# Patient Record
Sex: Female | Born: 1960 | Race: Black or African American | Hispanic: No | Marital: Single
Health system: Southern US, Community
[De-identification: ages and names within clinical notes are randomized; demographics above are authoritative.]

---

## 1998-09-20 ENCOUNTER — Other Ambulatory Visit: Admission: RE | Admit: 1998-09-20 | Discharge: 1998-09-20 | Payer: Self-pay | Admitting: Obstetrics

## 1998-09-23 ENCOUNTER — Other Ambulatory Visit: Admission: RE | Admit: 1998-09-23 | Discharge: 1998-09-23 | Payer: Self-pay | Admitting: Obstetrics

## 2019-05-02 ENCOUNTER — Telehealth: Payer: Self-pay

## 2019-05-02 NOTE — Telephone Encounter (Signed)
PT CALLED TO [facility] NEW PT APPT W/[facility]ANDERS ADV HER AT THIS TIME SANDERS IS NOT ACCEPTING SHE CAN SCHEDULE W/MOORE DFNP, PHONE DISCONNECTED, TRIED CALLING BACK NO ANS

## 2019-06-26 ENCOUNTER — Ambulatory Visit: Payer: 59 | Attending: Internal Medicine

## 2019-06-26 DIAGNOSIS — Z20822 Contact with and (suspected) exposure to covid-19: Secondary | ICD-10-CM | POA: Insufficient documentation

## 2019-06-27 LAB — NOVEL CORONAVIRUS, NAA: SARS-CoV-2, NAA: NOT DETECTED

## 2019-07-14 ENCOUNTER — Other Ambulatory Visit: Payer: Self-pay | Admitting: Gastroenterology

## 2019-07-14 DIAGNOSIS — K769 Liver disease, unspecified: Secondary | ICD-10-CM

## 2019-07-20 ENCOUNTER — Other Ambulatory Visit: Payer: Self-pay | Admitting: Gastroenterology

## 2019-08-08 ENCOUNTER — Other Ambulatory Visit: Payer: Self-pay

## 2019-08-31 ENCOUNTER — Ambulatory Visit
Admission: RE | Admit: 2019-08-31 | Discharge: 2019-08-31 | Disposition: A | Discharge status: Home / Self Care | Payer: 59 | Source: Ambulatory Visit | Attending: Gastroenterology | Admitting: Gastroenterology

## 2019-08-31 ENCOUNTER — Other Ambulatory Visit: Payer: Self-pay

## 2019-08-31 DIAGNOSIS — K769 Liver disease, unspecified: Secondary | ICD-10-CM

## 2019-08-31 IMAGING — MR MR ABDOMEN WO/W CM
17 series · 48 of 48 positions shown · IV contrast (16ml Multihance)
Comparison: CT AP [DATE]

CLINICAL DATA: Evaluate liver lesion.

EXAM:
MRI ABDOMEN WITHOUT AND WITH CONTRAST
TECHNIQUE: Multiplanar multisequence MR imaging of the abdomen was performed
both before and after the administration of intravenous contrast.
CONTRAST:  16mL MULTIHANCE GADOBENATE DIMEGLUMINE 529 MG/ML IV SOLN

[Series 3: T2 · coronal · 5.0mm · 1.56mm/px · 1 of 36 slices shown (1 of 3)]
[im 1/36]
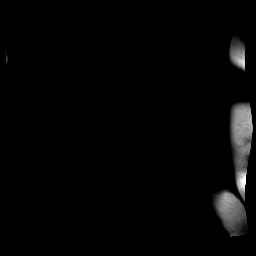

[Series 4: T1 · axial · 3.0mm · 1.19mm/px · z∈[-129,+108]mm · 6 of 160 slices shown]
[im 1/160]
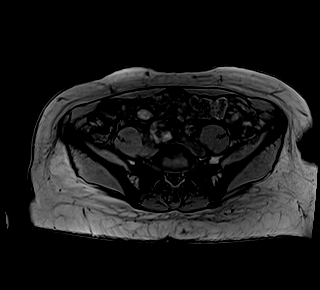
[im 32/160]
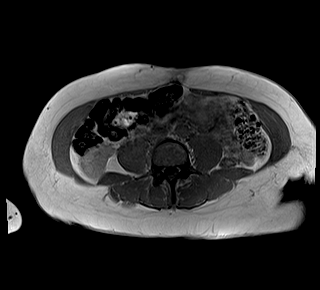
[im 64/160]
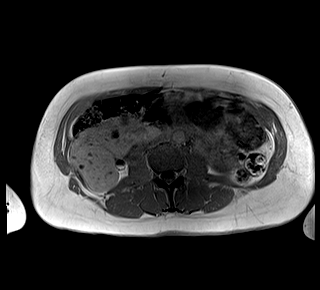
[im 96/160]
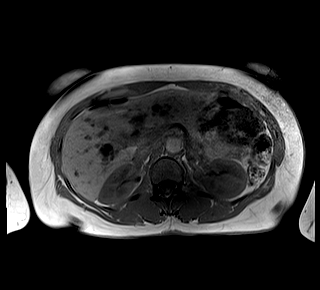
[im 128/160]
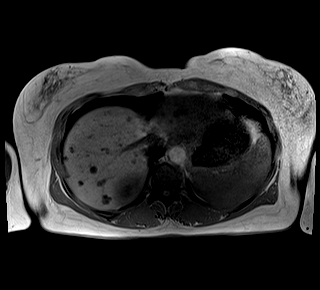
[im 160/160]
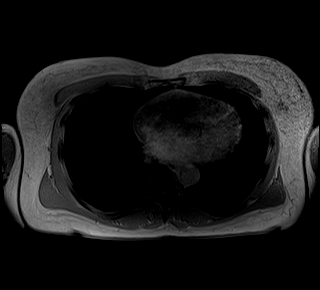

[Series 5: bSSFP · axial · 5.0mm · 1.25mm/px · 1 of 38 slices shown]
[im 1/38]
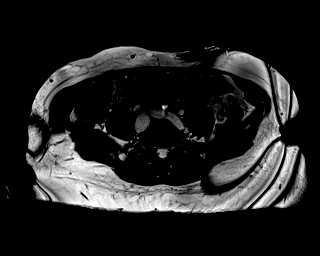

[Series 6: T2 · axial · 6.0mm · 1.19mm/px · 1 of 34 slices shown (2 of 3)]
[im 1/34]
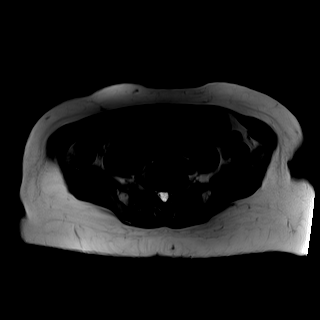

[Series 7: T2 · axial · 5.0mm · 1.48mm/px · z∈[-124,+128]mm · 2 of 43 slices shown (3 of 3)]
[im 1/43]
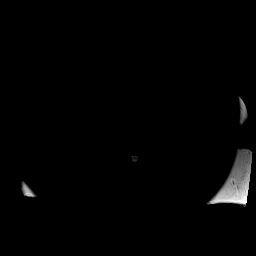
[im 43/43]
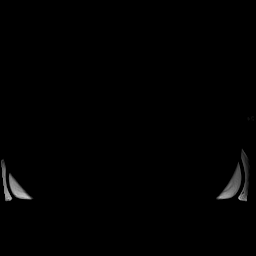

[Series 8: DWI · axial · 5.0mm · 1.42mm/px · z∈[-125,+115]mm · 5 of 123 slices shown (1 of 2)]
[im 1/123]
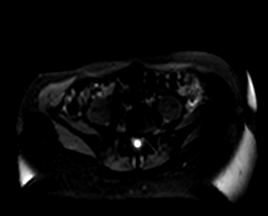
[im 31/123]
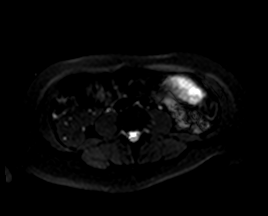
[im 62/123]
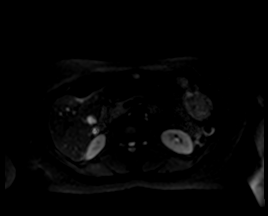
[im 92/123]
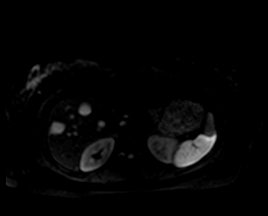
[im 123/123]
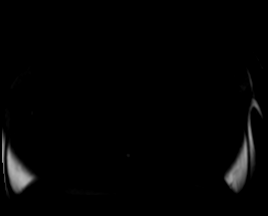

[Series 9: DWI · axial · 5.0mm · 1.42mm/px · z∈[-125,+115]mm · 2 of 41 slices shown (2 of 2)]
[im 1/41]
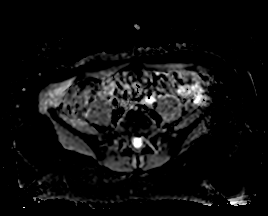
[im 41/41]
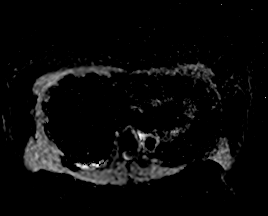

[Series 10: T1 dynamic · axial · non-contrast · 3.0mm · 1.25mm/px · z∈[-125,+112]mm · 3 of 80 slices shown]
[im 1/80]
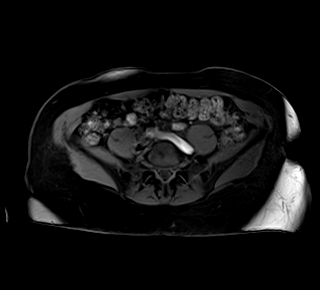
[im 40/80]
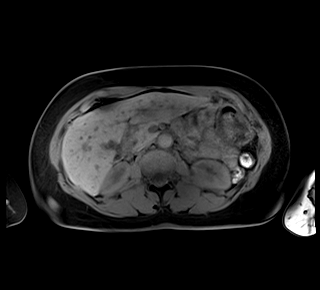
[im 80/80]
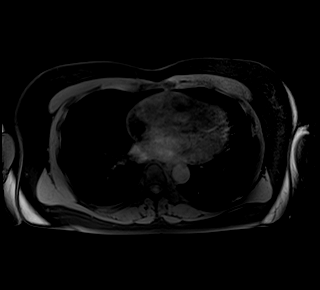

[Series 11: T1 dynamic post-contrast · axial · 3.0mm · 1.25mm/px · z∈[-125,+112]mm · 3 of 80 slices shown (1 of 9)]
[im 1/80]
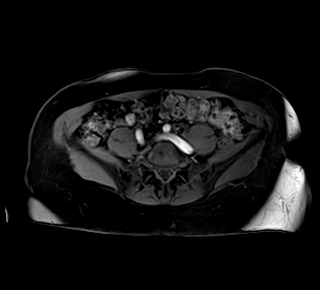
[im 40/80]
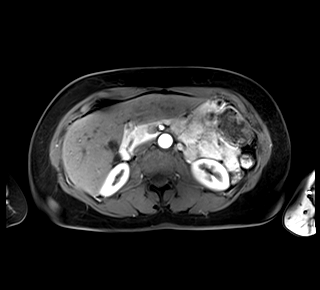
[im 80/80]
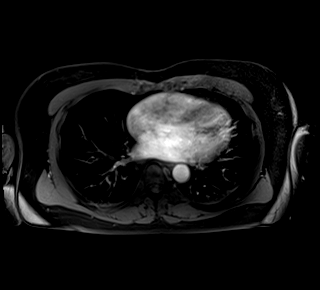

[Series 12: T1 dynamic post-contrast · axial · 3.0mm · 1.25mm/px · z∈[-125,+112]mm · 3 of 80 slices shown (2 of 9)]
[im 1/80]
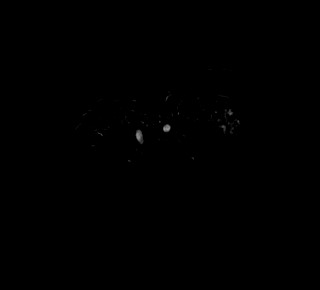
[im 40/80]
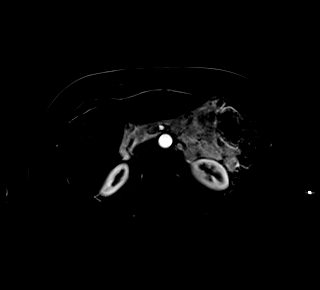
[im 80/80]
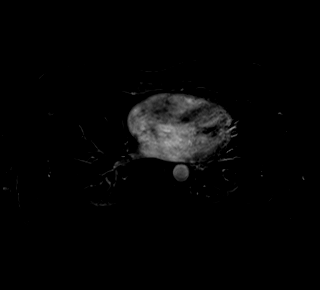

[Series 13: T1 dynamic post-contrast · axial · 3.0mm · 1.25mm/px · z∈[-125,+112]mm · 3 of 80 slices shown (3 of 9)]
[im 1/80]
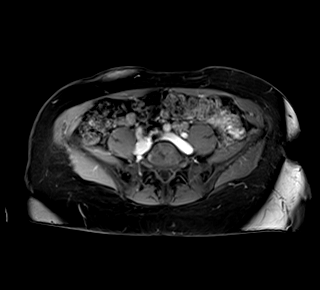
[im 40/80]
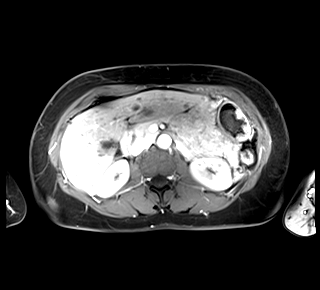
[im 80/80]
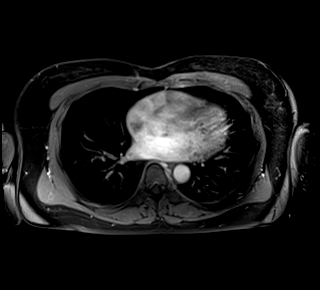

[Series 14: T1 dynamic post-contrast · axial · 3.0mm · 1.25mm/px · z∈[-125,+112]mm · 3 of 80 slices shown (4 of 9)]
[im 1/80]
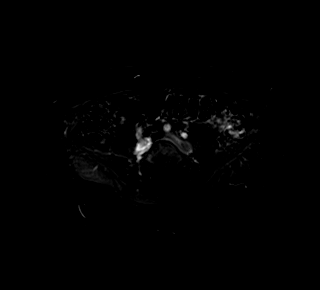
[im 40/80]
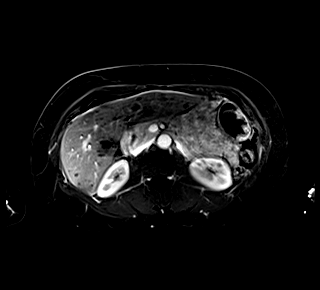
[im 80/80]
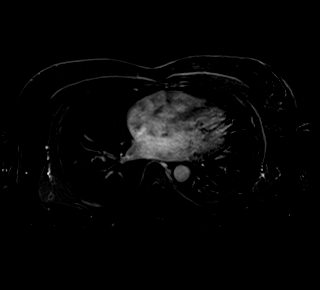

[Series 15: T1 dynamic post-contrast · axial · 3.0mm · 1.25mm/px · z∈[-125,+112]mm · 3 of 80 slices shown (5 of 9)]
[im 1/80]
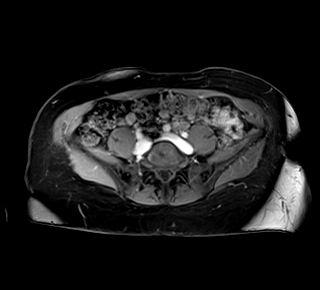
[im 40/80]
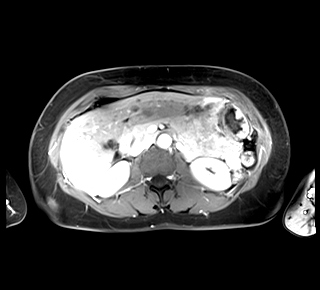
[im 80/80]
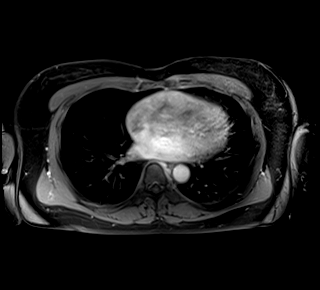

[Series 16: T1 dynamic post-contrast · axial · 3.0mm · 1.25mm/px · z∈[-125,+112]mm · 3 of 80 slices shown (6 of 9)]
[im 1/80]
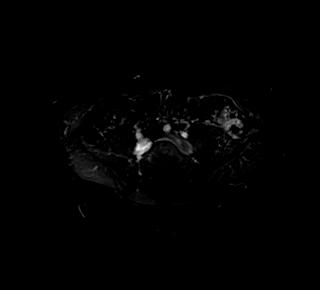
[im 40/80]
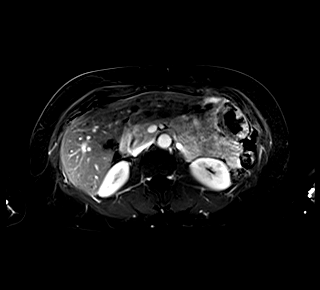
[im 80/80]
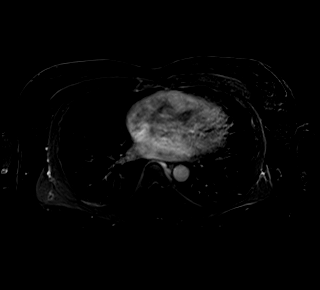

[Series 17: T1 dynamic post-contrast · coronal · 3.0mm · 1.25mm/px · 3 of 72 slices shown (7 of 9)]
[im 1/72]
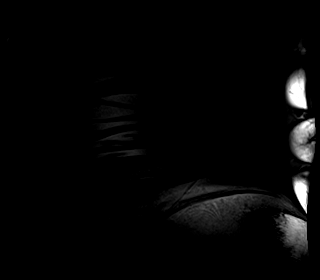
[im 36/72]
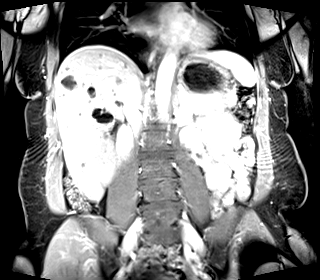
[im 72/72]
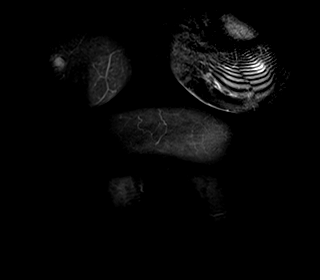

[Series 18: T1 dynamic post-contrast · axial · 3.0mm · 1.25mm/px · z∈[-125,+112]mm · 3 of 80 slices shown (8 of 9)]
[im 1/80]
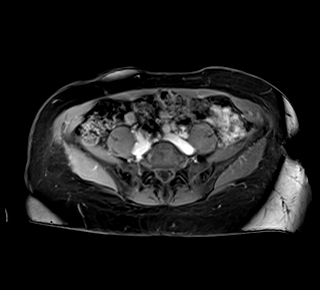
[im 40/80]
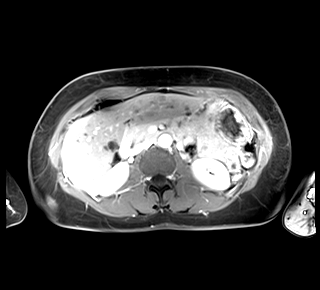
[im 80/80]
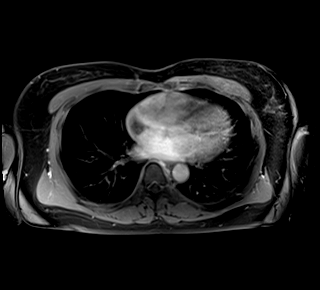

[Series 19: T1 dynamic post-contrast · axial · 3.0mm · 1.25mm/px · z∈[-125,+112]mm · 3 of 80 slices shown (9 of 9)]
[im 1/80]
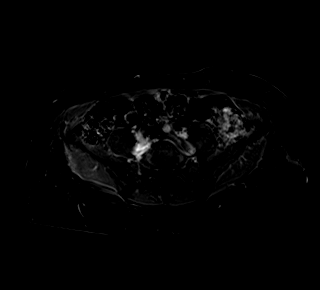
[im 40/80]
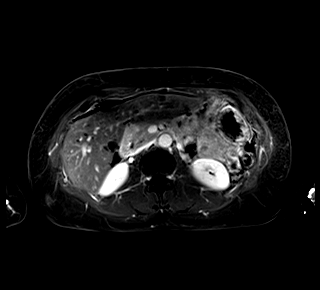
[im 80/80]
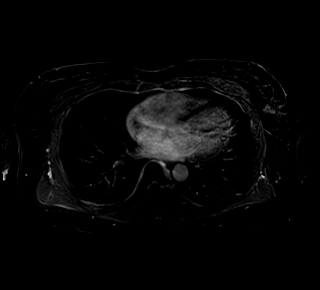

[48 of 48 positions shown; findings below may reference images not displayed]

FINDINGS: Lower chest: No acute findings.

Hepatobiliary: Normal appearance the gallbladder. No stones or
gallbladder wall thickening. The common bile duct is upper limits of
normal in caliber measuring 6 mm. Mild central biliary prominence.

Innumerable small liver cysts are identified throughout both lobes
of the liver as noted previously. Conglomeration of cysts with
central calcification is again noted within the left lobe of liver.
This measures 1.9 x 1.8 cm, image [DATE]. Unchanged from previous
exam. No convincing evidence for abnormal enhancement within this
area.

Pancreas: No mass, inflammatory changes, or other parenchymal
abnormality identified.

Spleen:  Within normal limits in size and appearance.

Adrenals/Urinary Tract: Normal appearance of the right kidney. Small
left kidney cyst is technically too small to reliably characterize
measuring 7 mm, image 38/15. No mass or hydronephrosis identified
bilaterally.

Stomach/Bowel: Visualized portions within the abdomen are
unremarkable.

Vascular/Lymphatic: No pathologically enlarged lymph nodes
identified. No abdominal aortic aneurysm demonstrated.

Other:  Fat containing umbilical hernia identified.

Musculoskeletal: No suspicious bone lesions identified.
IMPRESSION: 1. No acute findings within the upper abdomen.
2. Innumerable small liver cysts.
3. Conglomeration of cysts with central calcification is again noted
within the left lobe of liver. No abnormal enhancement identified.
This is unchanged in appearance from [DATE] and is favored to
represent a benign abnormality. Consider follow-up imaging in 12
months with liver protocol MRI or CT without and with contrast
material to ensure stability of this likely benign abnormality.
4. Fat containing umbilical hernia.

## 2019-08-31 MED ORDER — GADOBENATE DIMEGLUMINE 529 MG/ML IV SOLN
16.0000 mL | Freq: Once | INTRAVENOUS | Status: AC | PRN
  Administered 2019-08-31: 16 mL via INTRAVENOUS

## 2020-11-26 ENCOUNTER — Other Ambulatory Visit: Payer: Self-pay | Admitting: Gastroenterology

## 2020-11-26 DIAGNOSIS — K769 Liver disease, unspecified: Secondary | ICD-10-CM

## 2020-12-10 ENCOUNTER — Ambulatory Visit
Admission: RE | Admit: 2020-12-10 | Discharge: 2020-12-10 | Disposition: A | Discharge status: Home / Self Care | Payer: 59 | Source: Ambulatory Visit | Attending: Gastroenterology | Admitting: Gastroenterology

## 2020-12-10 ENCOUNTER — Other Ambulatory Visit: Payer: Self-pay

## 2020-12-10 DIAGNOSIS — K769 Liver disease, unspecified: Secondary | ICD-10-CM

## 2020-12-10 IMAGING — MR MR ABDOMEN WO/W CM
12 of 17 series · 26 of 48 positions shown · IV contrast (15 ml multihance)
Comparison: CT [DATE] and MRI [DATE]

CLINICAL DATA: Follow-up hepatic lesions.

EXAM:
MRI ABDOMEN WITHOUT AND WITH CONTRAST
TECHNIQUE: Multiplanar multisequence MR imaging of the abdomen was performed
both before and after the administration of intravenous contrast.
CONTRAST:  15mL MULTIHANCE GADOBENATE DIMEGLUMINE 529 MG/ML IV SOLN

[Series 3: cor haste · coronal · 5.0mm · 0.68mm/px · 1 of 28 slices shown]
[im 1/28]
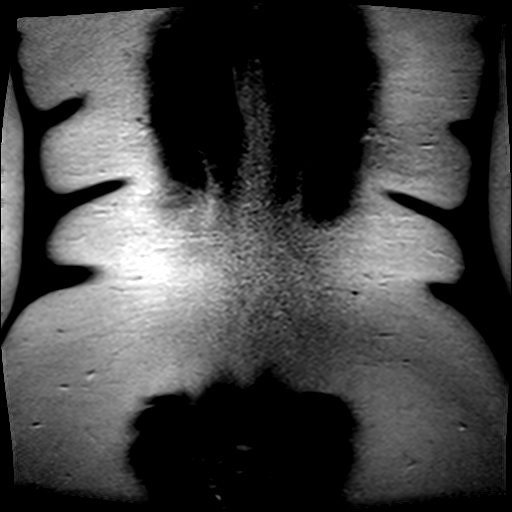

[Series 4: axial haste · axial · 6.0mm · 0.68mm/px · 1 of 37 slices shown]
[im 1/37]
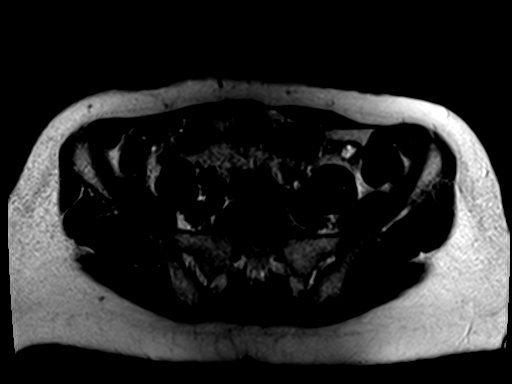

[Series 5: T1 · axial · 6.0mm · 0.68mm/px · z∈[-119,+118]mm · 2 of 74 slices shown]
[im 1/74]
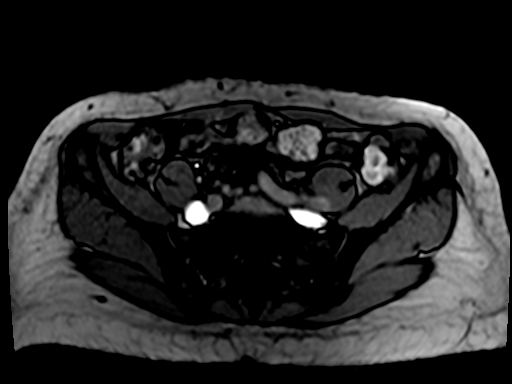
[im 74/74]
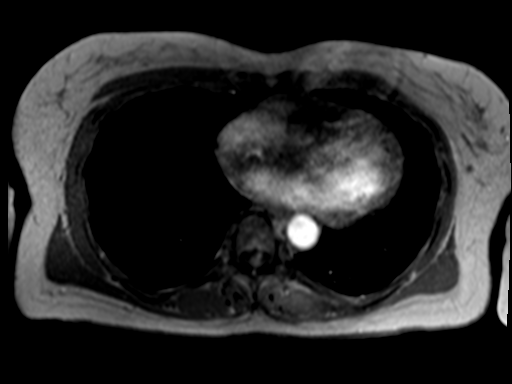

[Series 6: bSSFP · axial · 4.0mm · 0.68mm/px · 1 of 61 slices shown]
[im 1/61]
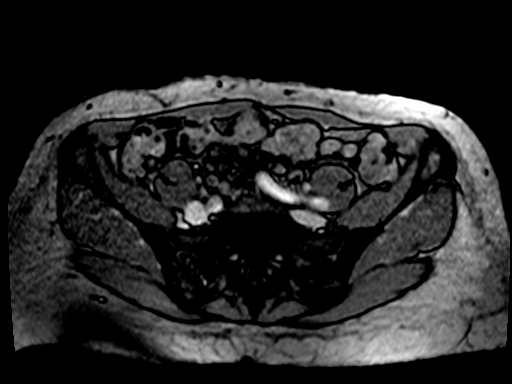

[Series 7: T2 fat-sat · axial · 6.0mm · 1.09mm/px · 1 of 34 slices shown]
[im 1/34]
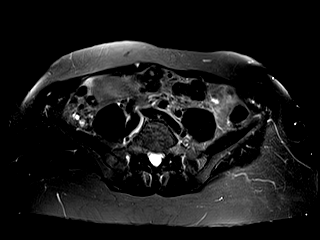

[Series 8: [id number]_trig · axial · 6.0mm · 1.82mm/px · z∈[-101,+143]mm · 3 of 105 slices shown]
[im 1/105]
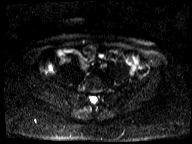
[im 53/105]
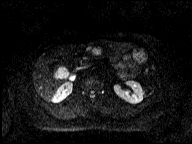
[im 105/105]
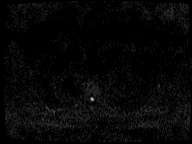

[Series 9: [id number]_trig_adc · axial · 6.0mm · 1.82mm/px · 1 of 35 slices shown]
[im 1/35]
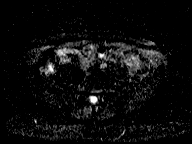

[Series 10: T1 dynamic · axial · non-contrast · 2.5mm · 0.74mm/px · z∈[-127,+130]mm · 3 of 104 slices shown]
[im 1/104]
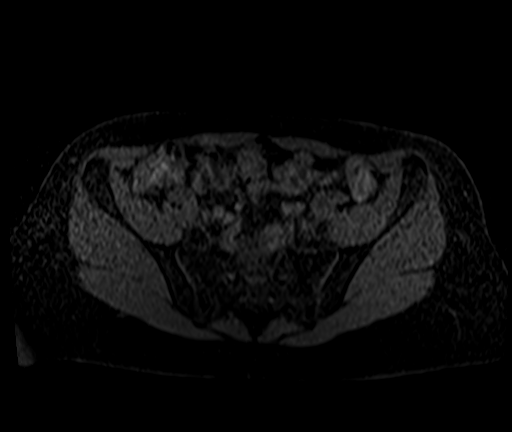
[im 52/104]
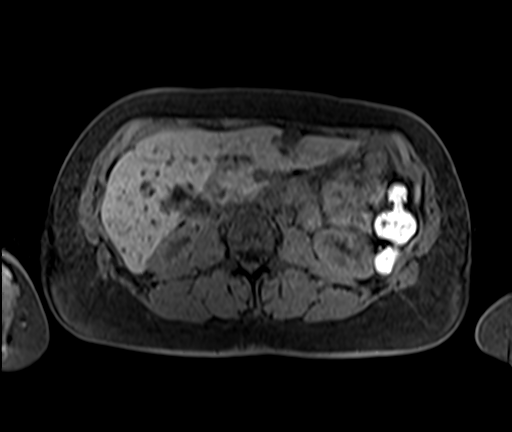
[im 104/104]
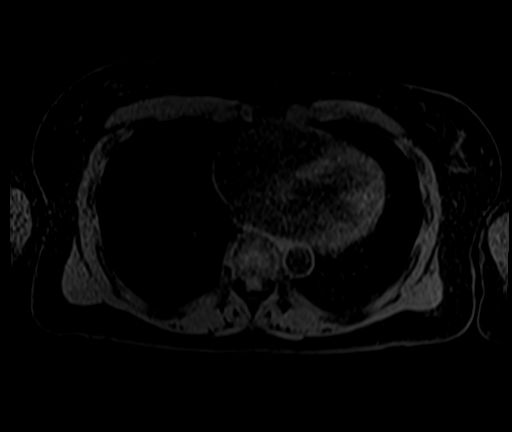

[Series 11: T1 dynamic post-contrast · axial · 2.5mm · 0.74mm/px · z∈[-127,+130]mm · 4 of 104 slices shown (1 of 4)]
[im 1/104]
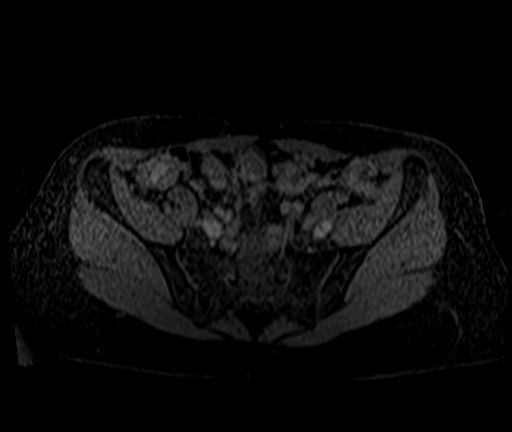
[im 35/104]
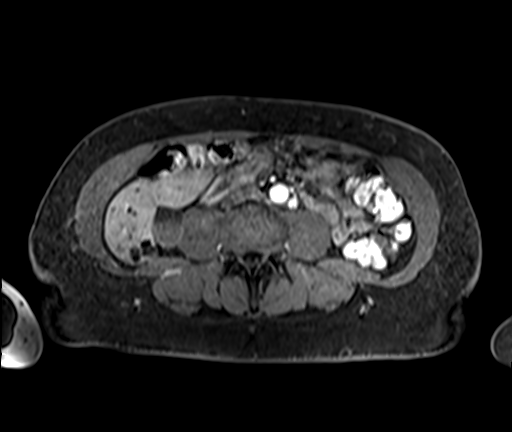
[im 69/104]
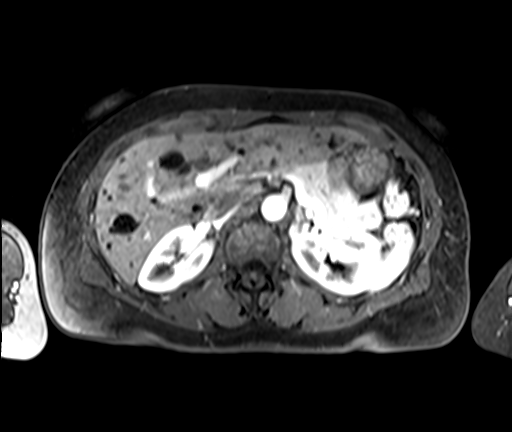
[im 104/104]
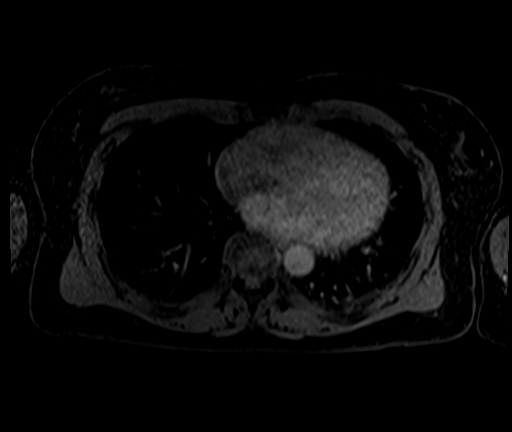

[Series 12: T1 dynamic post-contrast · axial · 2.5mm · 0.74mm/px · z∈[-127,+130]mm · 4 of 104 slices shown (2 of 4)]
[im 1/104]
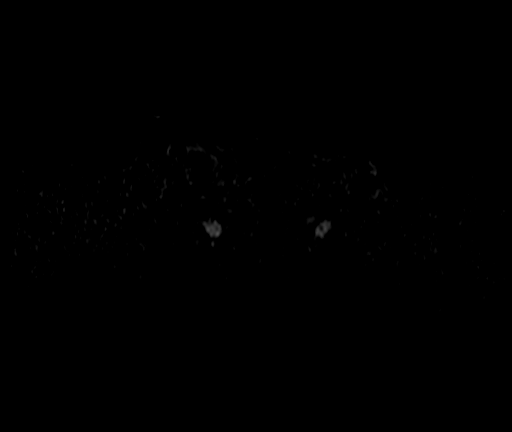
[im 35/104]
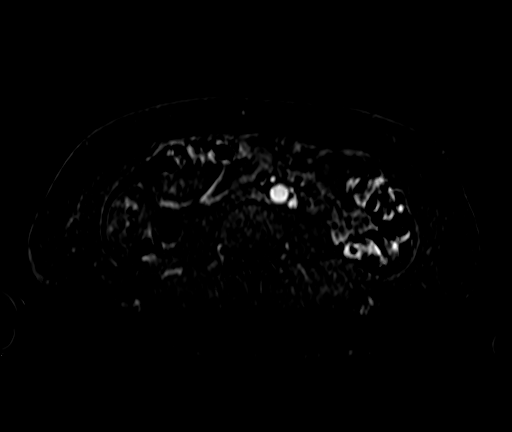
[im 69/104]
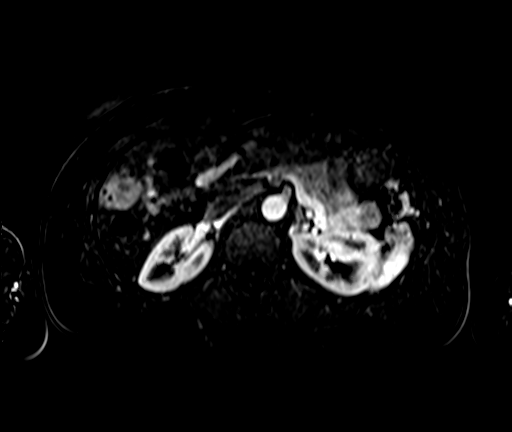
[im 104/104]
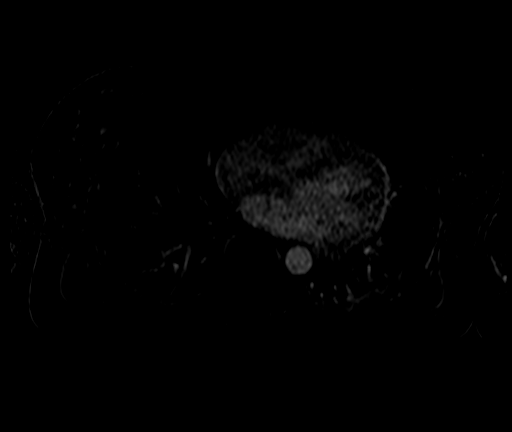

[Series 13: T1 dynamic post-contrast · axial · 2.5mm · 0.74mm/px · z∈[-127,+130]mm · 4 of 104 slices shown (3 of 4)]
[im 1/104]
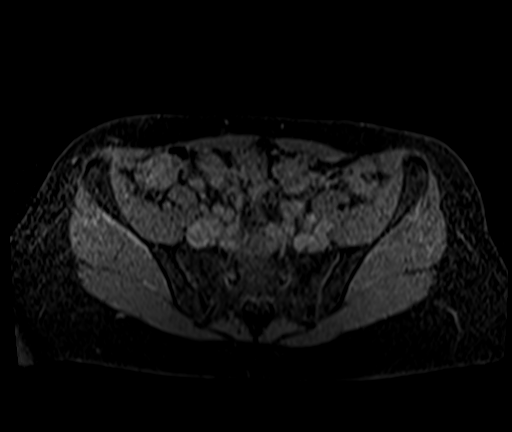
[im 35/104]
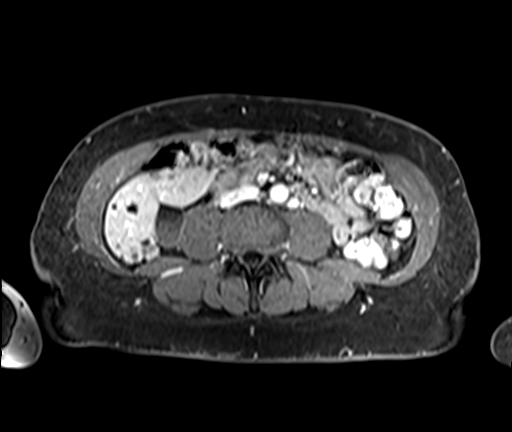
[im 69/104]
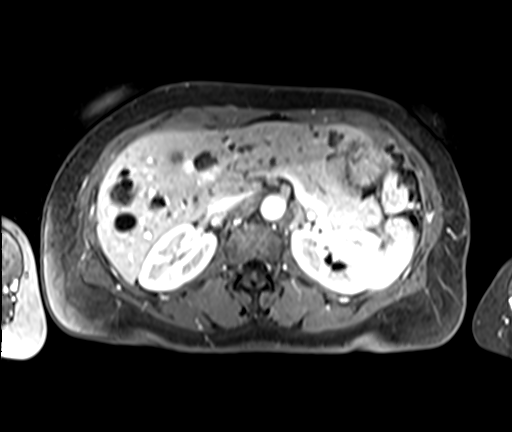
[im 104/104]
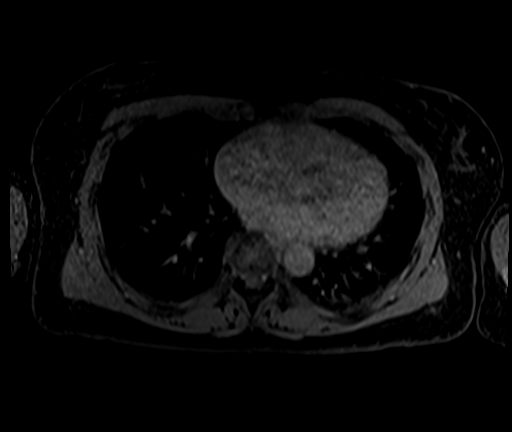

[Series 14: T1 dynamic post-contrast · axial · 2.5mm · 0.74mm/px · 1 of 104 slices shown (4 of 4)]
[im 1/104]
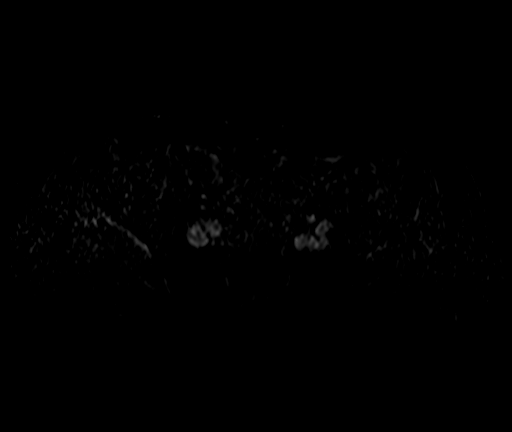

[26 of 48 positions shown; findings below may reference images not displayed]

FINDINGS: Lower chest: No acute abnormality.

Hepatobiliary: No hepatic steatosis. Innumerable bilobar hepatic
cysts are again visualized. Conglomeration of cysts with central
calcification is again noted within the left lobe of the liver
measuring 1.9 x 1.8 cm on image [DATE], unchanged from prior, without
convincing evidence of abnormal enhancement. No suspicious enhancing
hepatic masses visualized. Gallbladder is unremarkable. Similar mild
central biliary prominence with the common duct measuring up to 6
mm.

Pancreas: Intrinsic T1 signal a pancreatic parenchyma is within
normal limits. No pancreatic ductal dilation. No pancreatic divisum.
No cystic or arterially enhancing solid pancreatic masses
visualized.

Spleen:  Within normal limits in size and appearance.

Adrenals/Urinary Tract: Adrenals are unremarkable. No
hydronephrosis. Tiny bilateral renal cysts. No solid enhancing renal
lesions.

Stomach/Bowel: Visualized portions within the abdomen are
unremarkable.

Vascular/Lymphatic: No pathologically enlarged lymph nodes
identified. No abdominal aortic aneurysm demonstrated.

Other:  No abdominal ascites.  Fat containing umbilical hernia.

Musculoskeletal: No suspicious bone lesions identified.
IMPRESSION: 1. Similar appearance of the innumerable bilobar hepatic cysts
including the conglomeration of cysts with central calcification in
the left lobe of the liver. No convincing evidence of abnormal
enhancement. Nonspecific, however given the imaging appearance and
greater than 1 year stability this is favored represent a benign
abnormality. However, consider follow-up imaging in 12 months with
hepatic protocol MRI with and without contrast to ensure at least 2
years of stability.
2. Fat containing umbilical hernia.

## 2020-12-10 MED ORDER — GADOBENATE DIMEGLUMINE 529 MG/ML IV SOLN
15.0000 mL | Freq: Once | INTRAVENOUS | Status: AC | PRN
  Administered 2020-12-10: 15 mL via INTRAVENOUS

## 2021-11-20 ENCOUNTER — Other Ambulatory Visit: Payer: Self-pay | Admitting: Gastroenterology

## 2021-11-20 DIAGNOSIS — K769 Liver disease, unspecified: Secondary | ICD-10-CM

## 2021-12-01 ENCOUNTER — Other Ambulatory Visit (HOSPITAL_COMMUNITY): Payer: Self-pay | Admitting: Gastroenterology

## 2021-12-01 DIAGNOSIS — K769 Liver disease, unspecified: Secondary | ICD-10-CM

## 2021-12-06 ENCOUNTER — Ambulatory Visit (HOSPITAL_COMMUNITY)
Admission: RE | Admit: 2021-12-06 | Discharge: 2021-12-06 | Disposition: A | Discharge status: Home / Self Care | Payer: 59 | Source: Ambulatory Visit | Attending: Gastroenterology | Admitting: Gastroenterology

## 2021-12-06 DIAGNOSIS — K769 Liver disease, unspecified: Secondary | ICD-10-CM | POA: Insufficient documentation

## 2021-12-06 MED ORDER — GADOBUTROL 1 MMOL/ML IV SOLN
8.0000 mL | Freq: Once | INTRAVENOUS | Status: AC | PRN
  Administered 2021-12-06: 8 mL via INTRAVENOUS
